# Patient Record
Sex: Female | Born: 1995
Health system: Southern US, Academic
[De-identification: ages and names within clinical notes are randomized; demographics above are authoritative.]

## PROBLEM LIST (undated history)

## (undated) ENCOUNTER — Ambulatory Visit: Attending: Advanced Practice Midwife | Primary: Advanced Practice Midwife

## (undated) ENCOUNTER — Encounter

## (undated) ENCOUNTER — Telehealth

## (undated) ENCOUNTER — Encounter: Attending: Clinical | Primary: Clinical

## (undated) ENCOUNTER — Ambulatory Visit

## (undated) ENCOUNTER — Ambulatory Visit: Payer: MEDICAID

## (undated) ENCOUNTER — Encounter: Attending: Advanced Practice Midwife | Primary: Advanced Practice Midwife

## (undated) ENCOUNTER — Telehealth: Attending: Advanced Practice Midwife | Primary: Advanced Practice Midwife

## (undated) DIAGNOSIS — J45909 Unspecified asthma, uncomplicated: Secondary | ICD-10-CM

---
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
## 2016-02-28 ENCOUNTER — Ambulatory Visit (HOSPITAL_COMMUNITY)
Admission: EM | Admit: 2016-02-28 | Discharge: 2016-02-28 | Disposition: A | Discharge status: Home / Self Care | Payer: Medicaid Other | Attending: Family Medicine | Admitting: Family Medicine

## 2016-02-28 ENCOUNTER — Encounter (HOSPITAL_COMMUNITY): Payer: Self-pay | Admitting: Emergency Medicine

## 2016-02-28 DIAGNOSIS — J029 Acute pharyngitis, unspecified: Secondary | ICD-10-CM | POA: Diagnosis not present

## 2016-02-28 HISTORY — DX: Unspecified asthma, uncomplicated: J45.909

## 2016-02-28 LAB — POCT RAPID STREP A: Streptococcus, Group A Screen (Direct): NEGATIVE

## 2016-02-28 NOTE — ED Triage Notes (Signed)
Pt has been suffering from a sore throat since yesterday.  Pt denies any fever.

## 2016-02-28 NOTE — ED Provider Notes (Signed)
CSN: [id number]     Arrival date & time 02/28/16  1427 History   First MD Initiated Contact with Patient 02/28/16 1530     Chief Complaint  Patient presents with  . Sore Throat   (Consider location/radiation/quality/duration/timing/severity/associated sxs/prior Treatment) The history is provided by the patient.  Sore Throat  This is a new problem. The current episode started yesterday. The problem occurs constantly. The problem has been gradually worsening. Pertinent negatives include no chest pain, no abdominal pain, no headaches and no shortness of breath. The symptoms are aggravated by swallowing. Nothing relieves the symptoms. She has tried nothing for the symptoms.    Past Medical History:  Diagnosis Date  . Asthma    History reviewed. No pertinent surgical history. History reviewed. No pertinent family history. Social History  Substance Use Topics  . Smoking status: Never Smoker  . Smokeless tobacco: Never Used  . Alcohol use No   OB History    No data available     Review of Systems  Constitutional: Negative for fever.  HENT: Positive for ear pain and sore throat. Negative for congestion, rhinorrhea, sinus pain, sinus pressure and sneezing.   Respiratory: Negative for cough and shortness of breath.   Cardiovascular: Negative for chest pain.  Gastrointestinal: Negative for abdominal pain, nausea and vomiting.  Neurological: Negative for dizziness and headaches.    Allergies  Patient has no known allergies.  Home Medications   Prior to Admission medications   Not on File   Meds Ordered and Administered this Visit  Medications - No data to display  BP 125/81 (BP Location: Left Arm)   Pulse 88   Temp 98.7 F (37.1 C) (Oral)   SpO2 100%  No data found.   Physical Exam  Constitutional: She is oriented to person, place, and time. She appears well-developed and well-nourished.  HENT:  Head: Normocephalic and atraumatic.  Right Ear: External ear normal.   Left Ear: External ear normal.  Nose: Nose normal.  Mouth/Throat: Oropharynx is clear and moist. No oropharyngeal exudate.  TM normal bilaterally  Eyes: EOM are normal. Pupils are equal, round, and reactive to light.  Neck: Normal range of motion. Neck supple.  Cardiovascular: Normal rate, regular rhythm and normal heart sounds.   Pulmonary/Chest: Effort normal and breath sounds normal.  Abdominal: Soft. Bowel sounds are normal. She exhibits no distension. There is no tenderness.  Lymphadenopathy:    She has no cervical adenopathy.  Neurological: She is alert and oriented to person, place, and time.  Skin: Skin is warm and dry.  Psychiatric: She has a normal mood and affect.  Nursing note and vitals reviewed.   Urgent Care Course   Clinical Course     Procedures (including critical care time)  Labs Review Labs Reviewed  POCT RAPID STREP A    Imaging Review No results found.  MDM   1. Viral pharyngitis    Rapid strep negative. Culture pending. Informed to treat symptomatically with salt water gargle, honey, throat lozenges. May take ibuprofen for pain relief as well.  Informed that this is self-limited and will improve. Informed to return or f/u with PCP if sore persist in 1-2 weeks.    Lucia EstelleFeng Truth Wolaver, NP 02/28/16 1610

## 2016-02-29 LAB — CULTURE, GROUP A STREP (THRC)

## 2016-04-17 ENCOUNTER — Encounter (HOSPITAL_COMMUNITY): Payer: Self-pay | Admitting: Emergency Medicine

## 2016-04-17 ENCOUNTER — Emergency Department (HOSPITAL_COMMUNITY)
Admission: EM | Admit: 2016-04-17 | Discharge: 2016-04-17 | Disposition: A | Discharge status: Home / Self Care | Payer: Medicaid Other | Attending: Emergency Medicine | Admitting: Emergency Medicine

## 2016-04-17 DIAGNOSIS — J029 Acute pharyngitis, unspecified: Secondary | ICD-10-CM | POA: Insufficient documentation

## 2016-04-17 DIAGNOSIS — J45909 Unspecified asthma, uncomplicated: Secondary | ICD-10-CM | POA: Insufficient documentation

## 2016-04-17 LAB — RAPID STREP SCREEN (MED CTR MEBANE ONLY): Streptococcus, Group A Screen (Direct): NEGATIVE

## 2016-04-17 MED ORDER — DEXAMETHASONE 10 MG/ML FOR PEDIATRIC ORAL USE
10.0000 mg | Freq: Once | INTRAMUSCULAR | Status: AC
  Administered 2016-04-17: 10 mg via ORAL
  Filled 2016-04-17: qty 1

## 2016-04-17 NOTE — ED Notes (Signed)
Pt reports a sore throat and inflammation for the past 3 days.

## 2016-04-17 NOTE — ED Provider Notes (Signed)
Emergency Department Provider Note   I have reviewed the triage vital signs and the nursing notes.   HISTORY  Chief Complaint Sore Throat   HPI Brenda Gilmore is a 21 y.o. female with PMH of asthma presents to the emergency department for evaluation of sore throat for the past 3 days. She notes posterior pharyngeal swelling and pain with swallowing. She denies fever, cough, shaking chills. No sick contacts with similar symptoms. She denies any body aches, chest pain, vomiting or diarrhea. She is tried over-the-counter medications with little relief in symptoms. Pain is made worse with swallowing. No alleviating factors. Pain is sharp in quality and severe. Patient describes as constant pain.   Past Medical History:  Diagnosis Date  . Asthma     There are no active problems to display for this patient.   History reviewed. No pertinent surgical history.    Allergies Patient has no known allergies.  No family history on file.  Social History Social History  Substance Use Topics  . Smoking status: Never Smoker  . Smokeless tobacco: Never Used  . Alcohol use No    Review of Systems  Constitutional: No fever/chills Eyes: No visual changes. ENT: Positive sore throat. Cardiovascular: Denies chest pain. Respiratory: Denies shortness of breath. Gastrointestinal: No abdominal pain.  No nausea, no vomiting.  No diarrhea.  No constipation. Genitourinary: Negative for dysuria. Musculoskeletal: Negative for back pain. Skin: Negative for rash. Neurological: Negative for headaches, focal weakness or numbness.  10-point ROS otherwise negative.  ____________________________________________   PHYSICAL EXAM:  VITAL SIGNS: ED Triage Vitals  Enc Vitals Group     BP 04/17/16 0641 138/71     Pulse Rate 04/17/16 0641 88     Resp 04/17/16 0641 16     Temp 04/17/16 0641 98.6 F (37 C)     Temp Source 04/17/16 0641 Oral     SpO2 04/17/16 0641 100 %     Weight 04/17/16  0649 198 lb (89.8 kg)     Height 04/17/16 0649 5\' 2"  (1.575 m)     Pain Score 04/17/16 0649 9   Constitutional: Alert and oriented. Well appearing and in no acute distress. Eyes: Conjunctivae are normal.  Head: Atraumatic. Nose: No congestion/rhinnorhea. Mouth/Throat: Mucous membranes are moist.  Oropharynx with enlarged tonsils bilaterally and positive exudate. Managing oral secretions.  Neck: No stridor.  Cardiovascular: Normal rate, regular rhythm. Good peripheral circulation. Grossly normal heart sounds.   Respiratory: Normal respiratory effort.  No retractions. Lungs CTAB. Gastrointestinal: Soft and nontender. No distention.  Musculoskeletal: No lower extremity tenderness nor edema. No gross deformities of extremities. Neurologic:  Normal speech and language. No gross focal neurologic deficits are appreciated.  Skin:  Skin is warm, dry and intact. No rash noted.  ____________________________________________   LABS (all labs ordered are listed, but only abnormal results are displayed)  Labs Reviewed  RAPID STREP SCREEN (NOT AT [facility])  CULTURE, GROUP A STREP [facility])   ____________________________________________   PROCEDURES  Procedure(s) performed:   Procedures  None ____________________________________________   INITIAL IMPRESSION / ASSESSMENT AND PLAN / ED COURSE  Pertinent labs & imaging results that were available during my care of the patient were reviewed by me and considered in my medical decision making (see chart for details).  Patient resents to the emergency department for evaluation of sore throat for the past 3 days. On exam the patient has bilateral tonsillar hypertrophy with scant exudate. Plan for rapid strep test and Decadron. No indication for advanced  imaging of the neck. Patient's speaking in normal tone of voice and managing oral secretions. No respiratory distress.  Strep negative. Gave decadron and will discharge with plan for supportive care at  home and return precautions.  ____________________________________________  FINAL CLINICAL IMPRESSION(S) / ED DIAGNOSES  Final diagnoses:  Pharyngitis, unspecified etiology     MEDICATIONS GIVEN DURING THIS VISIT:  Medications  dexamethasone (DECADRON) 10 MG/ML injection for Pediatric ORAL use 10 mg (10 mg Oral Given 04/17/16 0808)     NEW OUTPATIENT MEDICATIONS STARTED DURING THIS VISIT:  There are no discharge medications for this patient.    Note:  This document was prepared using Dragon voice recognition software and may include unintentional dictation errors.  Alona Bene, MD Emergency Medicine   Maia Plan, MD 04/17/16 [phone]

## 2016-04-17 NOTE — ED Triage Notes (Signed)
Pt states she has been having a sore throat and throat inflammation. Pt states this has been going on now for the past 3 days. Pt reports no PCP locally.

## 2016-04-17 NOTE — Discharge Instructions (Signed)

## 2016-04-18 LAB — CULTURE, GROUP A STREP (THRC)

## 2016-08-10 ENCOUNTER — Encounter (HOSPITAL_COMMUNITY): Payer: Self-pay | Admitting: *Deleted

## 2016-08-10 ENCOUNTER — Emergency Department (HOSPITAL_COMMUNITY)
Admission: EM | Admit: 2016-08-10 | Discharge: 2016-08-10 | Disposition: A | Discharge status: Home / Self Care | Payer: Medicaid Other | Attending: Emergency Medicine | Admitting: Emergency Medicine

## 2016-08-10 DIAGNOSIS — M545 Low back pain: Secondary | ICD-10-CM | POA: Diagnosis not present

## 2016-08-10 DIAGNOSIS — R509 Fever, unspecified: Secondary | ICD-10-CM | POA: Insufficient documentation

## 2016-08-10 DIAGNOSIS — J45909 Unspecified asthma, uncomplicated: Secondary | ICD-10-CM | POA: Diagnosis not present

## 2016-08-10 DIAGNOSIS — J029 Acute pharyngitis, unspecified: Secondary | ICD-10-CM | POA: Diagnosis not present

## 2016-08-10 LAB — URINALYSIS, ROUTINE W REFLEX MICROSCOPIC
BILIRUBIN URINE: NEGATIVE
GLUCOSE, UA: NEGATIVE mg/dL
Hgb urine dipstick: NEGATIVE
KETONES UR: NEGATIVE mg/dL
Leukocytes, UA: NEGATIVE
NITRITE: NEGATIVE
PH: 6 (ref 5.0–8.0)
PROTEIN: NEGATIVE mg/dL
Specific Gravity, Urine: 1.021 (ref 1.005–1.030)

## 2016-08-10 LAB — RAPID STREP SCREEN (MED CTR MEBANE ONLY): Streptococcus, Group A Screen (Direct): NEGATIVE

## 2016-08-10 MED ORDER — IBUPROFEN 100 MG/5ML PO SUSP
800.0000 mg | Freq: Once | ORAL | Status: AC
  Administered 2016-08-10: 800 mg via ORAL
  Filled 2016-08-10: qty 40

## 2016-08-10 MED ORDER — SODIUM CHLORIDE 0.9 % IV BOLUS (SEPSIS)
1000.0000 mL | Freq: Once | INTRAVENOUS | Status: AC
  Administered 2016-08-10: 1000 mL via INTRAVENOUS

## 2016-08-10 MED ORDER — AMOXICILLIN 250 MG/5ML PO SUSR: 500.0000 mg | Freq: Two times a day (BID) | ORAL | 0 refills | Status: DC

## 2016-08-10 MED ORDER — ACETAMINOPHEN 325 MG PO TABS
ORAL_TABLET | ORAL | Status: AC
  Filled 2016-08-10: qty 2

## 2016-08-10 MED ORDER — ACETAMINOPHEN 325 MG PO TABS
650.0000 mg | ORAL_TABLET | Freq: Once | ORAL | Status: AC | PRN
  Administered 2016-08-10: 650 mg via ORAL

## 2016-08-10 MED ORDER — DEXAMETHASONE SODIUM PHOSPHATE 10 MG/ML IJ SOLN
10.0000 mg | Freq: Once | INTRAMUSCULAR | Status: AC
  Administered 2016-08-10: 10 mg via INTRAVENOUS
  Filled 2016-08-10: qty 1

## 2016-08-10 MED ORDER — AMOXICILLIN 250 MG/5ML PO SUSR
500.0000 mg | Freq: Once | ORAL | Status: AC
  Administered 2016-08-10: 500 mg via ORAL
  Filled 2016-08-10: qty 10

## 2016-08-10 NOTE — Discharge Instructions (Signed)
It was my pleasure taking care of you today!  Please take all of your antibiotics until finished!  Alternate between Tylenol and ibuprofen for fevers and pain.  Please follow-up with your primary care provider in regards to today's visit.  Return to the emergency department for inability to keep down fluids, new or worsening symptoms, any additional concerns.

## 2016-08-10 NOTE — ED Provider Notes (Signed)
[facility]-EMERGENCY DEPT Provider Note   CSN: [id number] Arrival date & time: 08/10/16  0805     History   Chief Complaint Chief Complaint  Patient presents with  . Fever  . Back Pain  . Sore Throat    HPI Brenda Gilmore is a 21 y.o. female.  The history is provided by the patient and medical records. No language interpreter was used.  Fever   Associated symptoms include sore throat. Pertinent negatives include no congestion.  Back Pain   Associated symptoms include a fever.  Sore Throat    Brenda Gilmore is a 21 y.o. female  with a PMH of asthma who presents to the Emergency Department complaining of sore throat and low back pain which began yesterday morning when she awoke. She felt warm, but did not check temperature. Temp of 100.7 in triage. States that she has had strep 3-4 times in the last year and this feels similar. Last time she had a strep test here, she states it was negative and she never received a phone call from culture, however the next week, she went to her PCP at home where the strep test was positive. No sick contacts. No cough, congestion, shortness of breath, dysuria, vaginal discharge, abdominal pain. No medications taken prior to arrival for symptoms. Able to tolerate fluids fine.    Past Medical History:  Diagnosis Date  . Asthma     There are no active problems to display for this patient.   History reviewed. No pertinent surgical history.  OB History    No data available       Home Medications    Prior to Admission medications   Medication Sig Start Date End Date Taking? Authorizing Provider  Adalimumab 40 MG/0.8ML PNKT Inject 40 mg into the skin every Thursday. 01/04/16  Yes [provider]  albuterol (PROAIR HFA) 108 (90 Base) MCG/ACT inhaler Inhale 2 puffs into the lungs every 4 (four) hours as needed for wheezing or shortness of breath.  11/17/14  Yes [provider]  medroxyPROGESTERone (DEPO-PROVERA) 150 MG/ML injection  Inject 150 mg into the muscle every 3 (three) months.   Yes [provider]  montelukast (SINGULAIR) 10 MG tablet Take 10 mg by mouth daily as needed (for allergies).  11/17/14  Yes [provider]  amoxicillin (AMOXIL) 250 MG/5ML suspension Take 10 mLs (500 mg total) by mouth 2 (two) times daily. 08/10/16   Miracle Criado, Chase PicketJaime Pilcher, PA-C    Family History History reviewed. No pertinent family history.  Social History Social History  Substance Use Topics  . Smoking status: Never Smoker  . Smokeless tobacco: Never Used  . Alcohol use No     Allergies   Patient has no known allergies.   Review of Systems Review of Systems  Constitutional: Positive for fever.  HENT: Positive for sore throat. Negative for congestion, ear pain, sinus pressure and voice change.   Musculoskeletal: Positive for back pain.     Physical Exam Updated Vital Signs BP 111/69   Pulse 90   Temp 99.7 F (37.6 C) (Oral)   Resp 18   Ht 5\' 2"  (1.575 m)   Wt 88.5 kg (195 lb)   SpO2 100%   BMI 35.67 kg/m   Physical Exam  Constitutional: She is oriented to person, place, and time. She appears well-developed and well-nourished. No distress.  HENT:  Head: Normocephalic and atraumatic.  OP with erythema and tonsillar hypertrophy. Small amount of exudates. No PTA. No neck tenderness. No  hoarseness.  Neck: Normal range of motion. Neck supple.  No meningeal signs.   Cardiovascular: Normal heart sounds.   Tachycardic but regular.  Pulmonary/Chest: Effort normal.  Lungs are clear to auscultation bilaterally - no w/r/r  Abdominal: Soft. She exhibits no distension. There is no tenderness.  Musculoskeletal: Normal range of motion.  Neurological: She is alert and oriented to person, place, and time.  Skin: Skin is warm and dry. She is not diaphoretic.  Nursing note and vitals reviewed.    ED Treatments / Results  Labs (all labs ordered are listed, but only abnormal results are displayed) Labs  Reviewed  URINALYSIS, ROUTINE W REFLEX MICROSCOPIC - Abnormal; Notable for the following:       Result Value   APPearance HAZY (*)    All other components within normal limits  RAPID STREP SCREEN (NOT AT [facility])  CULTURE, GROUP A STREP [facility])    EKG  EKG Interpretation None       Radiology No results found.  Procedures Procedures (including critical care time)  Medications Ordered in ED Medications  acetaminophen (TYLENOL) 325 MG tablet (not administered)  acetaminophen (TYLENOL) tablet 650 mg (650 mg Oral Given 08/10/16 0819)  dexamethasone (DECADRON) injection 10 mg (10 mg Intravenous Given 08/10/16 0913)  ibuprofen (ADVIL,MOTRIN) 100 MG/5ML suspension 800 mg (800 mg Oral Given 08/10/16 1008)  sodium chloride 0.9 % bolus 1,000 mL (1,000 mLs Intravenous New Bag/Given 08/10/16 1123)  amoxicillin (AMOXIL) 250 MG/5ML suspension 500 mg (500 mg Oral Given 08/10/16 1123)     Initial Impression / Assessment and Plan / ED Course  I have reviewed the triage vital signs and the nursing notes.  Pertinent labs & imaging results that were available during my care of the patient were reviewed by me and considered in my medical decision making (see chart for details).    Brenda Gilmore is a 21 y.o. female who presents to ED for sore throat and low back pain which began yesterday morning. History of strep in the past and this feels similar. Strep test in December 2017 and January 2018 negative, but culture was positive. On exam, patient is febrile (100.7), tachycardic with erythematous, hypertrophied tonsils. Given decadron in ED. Anti-pyretics also given. Since she has had two negative tests with positive cultures along with exam concerning for strep, will treat with amoxil.   Patient still mildly tachycardic. Fever improving. 1L IV fluids given.   On reevaluation, the patient feels improved. Heart rate normalized after fluids. Temp down to 99.7. Home care instructions discussed with patient. Rx  for Amoxil given. PCP follow-up encouraged. All questions answered.   Final Clinical Impressions(s) / ED Diagnoses   Final diagnoses:  Pharyngitis, unspecified etiology    New Prescriptions New Prescriptions   AMOXICILLIN (AMOXIL) 250 MG/5ML SUSPENSION    Take 10 mLs (500 mg total) by mouth 2 (two) times daily.     Judeth Gilles, Chase Picket, PA-C 08/10/16 1220    Raeford Razor, MD 08/11/16 [phone]

## 2016-08-10 NOTE — ED Triage Notes (Signed)
PT reports she has had  Strep . Throat 4 times this year.  Pt reports she has not seen an ENT doctor.

## 2016-08-10 NOTE — ED Triage Notes (Signed)
Pt reports having fever, bodyaches, chills, headache, sore throat and lower back pain since yesterday morning. Denies urinary symptoms. Mask on pt at triage.

## 2016-08-12 LAB — CULTURE, GROUP A STREP (THRC)

## 2016-09-25 MED FILL — HUMIRA PF PEN (BOX)/40MG/0.8mL/PEN: HUMIRA PF PEN (BOX)/40MG/0.8mL/PEN | 28 days supply | Qty: 2 | Fill #4

## 2016-10-16 NOTE — Unmapped (Signed)
Specialty Pharmacy Refill Coordination Note     Meredith Turner is a 21 y.o. female contacted today regarding refills of her specialty medication(s).    Reviewed and verified with patient:     [address] [place] [address]  Specialty medication(s) and dose(s) confirmed: yes  Changes to medications: no  Changes to insurance: no    Medication Adherence    Patient reported X missed doses in the last month:  0  Specialty Medication:  Humira  Medication Assistance Program  Refill Coordination  Has the Patient's Contact Information Changed:  No  Is the Shipping Address Different:  No  Shipping Information  Delivery Scheduled:  Yes  Delivery Date:  10/24/16  Medications to be Shipped:  Humira             Marzetta Board  Specialty Pharmacy Technician

## 2016-10-23 MED FILL — HUMIRA PF PEN (BOX)/40MG/0.8mL/PEN: HUMIRA PF PEN (BOX)/40MG/0.8mL/PEN | 28 days supply | Qty: 2 | Fill #5

## 2016-11-11 NOTE — Unmapped (Signed)
Specialty Pharmacy Refill Coordination Note     Meredith Turner is a 21 y.o. female contacted today regarding refills of her specialty medication(s).    Reviewed and verified with patient:     [address]  [place] [address]    Specialty medication(s) and dose(s) confirmed: yes  Changes to medications: no  Changes to insurance: no    Medication Adherence    Patient reported X missed doses in the last month:  0  Specialty Medication:  HUMIRA  Medication Assistance Program  Refill Coordination  Has the Patient's Contact Information Changed:  No  Is the Shipping Address Different:  No  Shipping Information  Delivery Scheduled:  Yes  Delivery Date:  11/26/16  Medications to be Shipped:  HUMIRA       Marzetta Board  Specialty Pharmacy Technician

## 2016-11-25 ENCOUNTER — Ambulatory Visit: Admission: RE | Admit: 2016-11-25 | Discharge: 2016-11-25

## 2016-11-25 DIAGNOSIS — L910 Hypertrophic scar: Secondary | ICD-10-CM

## 2016-11-25 DIAGNOSIS — L732 Hidradenitis suppurativa: Principal | ICD-10-CM

## 2016-11-25 NOTE — Unmapped (Signed)
Had to leave a voicemail for pt, when I tried to bill her Humira, she only has family planning Medicaid. We will need her to call them to get that straighten out or an updated insurance-ja

## 2016-11-25 NOTE — Unmapped (Signed)
ASSESSMENT AND PLAN:    1. Hidradenitis suppurativa: better currently   - stop adalimumab.  She would like to try being off for a while. She knows to restart with flaring and discussed re-loading protocol.  May also consider punch unroofings if recurrence is limited to the two small nodules on the chest.   -continue clindamycin wipes bid  -last quant gold 12/2015    rtc 3-6 months.     CHIEF COMPLAINT:  Hidradenitis suppurativa    HPI:   This is a pleasant 21 y.o.-year-old who last saw Dr. Janyth Turner in 08/2016. She presents today for follow up of hidradenitis.   It is located underneath both breasts and is much better following unroofings in 04/2015.  She has had 2 small areas that have occasionally flared within and adjacent to the scar and restarted on Humira 12/2015.   No side effects noted from the the Humira. She would like to try stopping the Humira today.    DLQI 2, VAS Pain 0, Self reported severity 1    Disease Course  Previous Treatments: Doxycycline, ILK, Hibiclens, Topical Clindamycin  Symptoms since age 69  Family History: none  Relationship to menses: none  Current form of contraception: none  Nonsmoker  PAST MEDICAL HISTORY:  Hidradenitis  Acne    MEDICATIONS:      Current Outpatient Prescriptions on File Prior to Visit   Medication Sig Dispense Refill   ??? adalimumab (HUMIRA) 40 mg/0.8 mL subcutaneous pen kit Inject one pen subcutaneously every 7 days 6 each 8   ??? amoxicillin (AMOXIL) 250 mg/5 mL suspension Take 500 mg by mouth.     ??? fluocinonide (LIDEX) 0.05 % ointment Apply twice a day to flared spots on chest as needed 60 g 5   ??? fluticasone (FLONASE) 50 mcg/actuation nasal spray instill 1 spray into each nostril once daily instill 2 sprays into each nostril once daily prn  1   ??? medroxyPROGESTERone (DEPO-PROVERA) 150 mg/mL injection INJECT I.M. EVERY 3 MONTHS  0   ??? montelukast (SINGULAIR) 10 mg tablet Take 10 mg by mouth daily.  1   ??? PROAIR HFA 90 mcg/actuation inhaler INHALE 2 PUFFS EVERY 4 TO 6 HOURS AS NEEDED FOR WHEEZING  1   ??? triamcinolone (KENALOG) 0.1 % ointment Apply topically Two (2) times a day. Twice daily as needed for 2 weeks as needed 80 g 1     Current Facility-Administered Medications on File Prior to Visit   Medication Dose Route Frequency Provider Last Rate Last Dose   ??? triamcinolone acetonide (KENALOG-40) injection 20 mg  20 mg Other Once Meredith Haines, MD           ALLERGIES:   Patient has no known allergies.      REVIEW OF SYSTEMS:  Baseline state of health. No recent illnesses. No other skin complaints. The balance of 10 systems negative.    PHYSICAL EXAMINATION:  General: Well-developed, well-nourished. No acute distress.   Neuro: Alert and oriented, answers questions appropriately.  Skin: examination of the scalp, face, neck, chest, abdomen,and bilateral upper extremities was performed and notable for the following:  --In the inframammary region: well-healed scars on bilateral inframammary areas with two small non-inflammatory nodules ~3-36mm in size within/adjacent to unroofing sites.

## 2016-11-25 NOTE — Unmapped (Addendum)

## 2016-12-17 NOTE — Unmapped (Signed)
Spoke with patient, and she is still off of Humira therapy. Removing from [facility] call list for now, but patient has my contact info in case she decides to resume therapy.    Keyunna Coco A. Katrinka Blazing, PharmD - Pharmacist   [facility]   [address] [place], [place] [address]   t [phone] - f [phone]

## 2017-03-23 ENCOUNTER — Emergency Department (HOSPITAL_COMMUNITY)
Admission: EM | Admit: 2017-03-23 | Discharge: 2017-03-23 | Disposition: A | Discharge status: Home / Self Care | Payer: Self-pay | Attending: Emergency Medicine | Admitting: Emergency Medicine

## 2017-03-23 ENCOUNTER — Encounter (HOSPITAL_COMMUNITY): Payer: Self-pay | Admitting: Emergency Medicine

## 2017-03-23 ENCOUNTER — Other Ambulatory Visit: Payer: Self-pay

## 2017-03-23 DIAGNOSIS — J45909 Unspecified asthma, uncomplicated: Secondary | ICD-10-CM | POA: Insufficient documentation

## 2017-03-23 DIAGNOSIS — B9789 Other viral agents as the cause of diseases classified elsewhere: Secondary | ICD-10-CM

## 2017-03-23 DIAGNOSIS — Z79899 Other long term (current) drug therapy: Secondary | ICD-10-CM | POA: Insufficient documentation

## 2017-03-23 DIAGNOSIS — J069 Acute upper respiratory infection, unspecified: Secondary | ICD-10-CM | POA: Insufficient documentation

## 2017-03-23 LAB — RAPID STREP SCREEN (MED CTR MEBANE ONLY): Streptococcus, Group A Screen (Direct): NEGATIVE

## 2017-03-23 MED ORDER — BENZONATATE 100 MG PO CAPS: 100.0000 mg | ORAL_CAPSULE | Freq: Three times a day (TID) | ORAL | 0 refills | Status: DC

## 2017-03-23 MED ORDER — GUAIFENESIN 100 MG/5ML PO LIQD: 100.0000 mg | ORAL | 0 refills | Status: DC | PRN

## 2017-03-23 NOTE — ED Provider Notes (Signed)
[facility]-EMERGENCY DEPT Provider Note   CSN: [id number] Arrival date & time: 03/23/17  1333     History   Chief Complaint Chief Complaint  Patient presents with  . Cough  . Otalgia    HPI Brenda Gilmore is a 22 y.o. female.  HPI   22 year old female here with cough and ear pain. Started 4 days ago, productive with yellow mucous, worse at night time.  Tries OTC meds without relief.  Hx of asthma, non smoker.  Has headache, fever, chills without myalgias.  Report pressure in her head, watery eyes but non itching.  Has ear pain and pain with swallowing, runny nose.  Report wheeze with cough and sob.  No n/v/d.  Symptoms that bothers her the most is sore throat. States she has history of strep throat in the past. Also reports she has history of cheese and her allergy medication isn't helping.  Past Medical History:  Diagnosis Date  . Asthma     There are no active problems to display for this patient.   History reviewed. No pertinent surgical history.  OB History    No data available       Home Medications    Prior to Admission medications   Medication Sig Start Date End Date Taking? Authorizing Provider  albuterol (PROAIR HFA) 108 (90 Base) MCG/ACT inhaler Inhale 2 puffs into the lungs every 4 (four) hours as needed for wheezing or shortness of breath.  11/17/14  Yes [provider]  fluticasone (FLONASE) 50 MCG/ACT nasal spray Place 2 sprays into both nostrils daily as needed for allergies or rhinitis.   Yes [provider]  montelukast (SINGULAIR) 10 MG tablet Take 10 mg by mouth daily as needed (for allergies).  11/17/14  Yes [provider]  phenol (EQ SORE THROAT SPRAY) 1.4 % LIQD Use as directed 1 spray in the mouth or throat as needed for throat irritation / pain.   Yes [provider]  Adalimumab 40 MG/0.8ML PNKT Inject 40 mg into the skin every Thursday. 01/04/16   [provider]  amoxicillin  (AMOXIL) 250 MG/5ML suspension Take 10 mLs (500 mg total) by mouth 2 (two) times daily. Patient not taking: Reported on 03/23/2017 08/10/16   Ward, Chase PicketJaime Pilcher, PA-C    Family History History reviewed. No pertinent family history.  Social History Social History   Tobacco Use  . Smoking status: Never Smoker  . Smokeless tobacco: Never Used  Substance Use Topics  . Alcohol use: No  . Drug use: No     Allergies   Patient has no known allergies.   Review of Systems Review of Systems  All other systems reviewed and are negative.    Physical Exam Updated Vital Signs BP (!) 143/83 (BP Location: Right Arm)   Pulse 85   Temp 98.1 F (36.7 C) (Oral)   Resp 18   LMP 03/11/2017 (Approximate)   SpO2 100%   Physical Exam  Constitutional: She appears well-developed and well-nourished. No distress.  Obese female nontoxic in appearance  HENT:  Head: Atraumatic.  Ears: Normal TMs bilaterally Nose: Mildly boggy turbinate Throat: Uvula is midline, bilateral tonsillar enlargement without exudate, no trismus   Eyes: Conjunctivae are normal.  Neck: Neck supple.  Cardiovascular: Normal rate and regular rhythm.  Pulmonary/Chest: Effort normal and breath sounds normal. No stridor. No respiratory distress. She has no wheezes. She has no rales.  Abdominal: Soft. There is no tenderness.  Lymphadenopathy:  She has no cervical adenopathy.  Neurological: She is alert.  Skin: No rash noted.  Psychiatric: She has a normal mood and affect.  Nursing note and vitals reviewed.    ED Treatments / Results  Labs (all labs ordered are listed, but only abnormal results are displayed) Labs Reviewed  RAPID STREP SCREEN (NOT AT [facility])  CULTURE, GROUP A STREP [facility])    EKG  EKG Interpretation None       Radiology No results found.  Procedures Procedures (including critical care time)  Medications Ordered in ED Medications - No data to display   Initial Impression / Assessment  and Plan / ED Course  I have reviewed the triage vital signs and the nursing notes.  Pertinent labs & imaging results that were available during my care of the patient were reviewed by me and considered in my medical decision making (see chart for details).     BP (!) 143/83 (BP Location: Right Arm)   Pulse 85   Temp 98.1 F (36.7 C) (Oral)   Resp 18   LMP 03/11/2017 (Approximate)   SpO2 100%    Final Clinical Impressions(s) / ED Diagnoses   Final diagnoses:  Viral URI with cough    ED Discharge Orders        Ordered    benzonatate (TESSALON) 100 MG capsule  Every 8 hours     03/23/17 1804    guaiFENesin (ROBITUSSIN) 100 MG/5ML liquid  Every 4 hours PRN     03/23/17 1804     Pt symptoms consistent with URI.  Pt will be discharged with symptomatic treatment.  Discussed return precautions.  Pt is hemodynamically stable & in NAD prior to discharge.     Fayrene Helper, PA-C 03/23/17 1805    Arby Barrette, MD 03/31/17 [phone]

## 2017-03-23 NOTE — ED Triage Notes (Signed)
Pt c/o cough and bilateral ear pain x several days. Pt has only taken Singulair.

## 2017-03-26 LAB — CULTURE, GROUP A STREP (THRC)

## 2017-09-03 ENCOUNTER — Encounter

## 2018-12-28 ENCOUNTER — Ambulatory Visit: Admit: 2018-12-28 | Discharge: 2018-12-29

## 2018-12-28 DIAGNOSIS — L910 Hypertrophic scar: Principal | ICD-10-CM

## 2018-12-28 DIAGNOSIS — L732 Hidradenitis suppurativa: Principal | ICD-10-CM

## 2018-12-28 DIAGNOSIS — Z79899 Other long term (current) drug therapy: Principal | ICD-10-CM

## 2018-12-28 MED ORDER — ADALIMUMAB 80 MG/0.8 ML SUBCUTANEOUS PEN KIT: kit | 0 refills | 0 days | Status: AC

## 2018-12-28 MED ORDER — ADALIMUMAB PEN CITRATE FREE 40 MG/0.4 ML: each | 11 refills | 0 days | Status: AC

## 2018-12-29 DIAGNOSIS — Z79899 Other long term (current) drug therapy: Principal | ICD-10-CM

## 2019-06-15 ENCOUNTER — Encounter: Admit: 2019-06-15 | Discharge: 2019-06-16 | Payer: PRIVATE HEALTH INSURANCE

## 2019-07-05 ENCOUNTER — Encounter: Admit: 2019-07-05 | Discharge: 2019-07-06 | Payer: PRIVATE HEALTH INSURANCE

## 2019-07-06 ENCOUNTER — Ambulatory Visit: Admit: 2019-07-06 | Discharge: 2019-07-07 | Payer: PRIVATE HEALTH INSURANCE

## 2019-07-19 ENCOUNTER — Ambulatory Visit (INDEPENDENT_AMBULATORY_CARE_PROVIDER_SITE_OTHER): Payer: Self-pay

## 2019-07-19 ENCOUNTER — Ambulatory Visit (HOSPITAL_COMMUNITY)
Admission: EM | Admit: 2019-07-19 | Discharge: 2019-07-19 | Disposition: A | Discharge status: Home / Self Care | Payer: Self-pay | Attending: Internal Medicine | Admitting: Internal Medicine

## 2019-07-19 ENCOUNTER — Encounter (HOSPITAL_COMMUNITY): Payer: Self-pay

## 2019-07-19 ENCOUNTER — Other Ambulatory Visit: Payer: Self-pay

## 2019-07-19 DIAGNOSIS — S139XXA Sprain of joints and ligaments of unspecified parts of neck, initial encounter: Secondary | ICD-10-CM

## 2019-07-19 MED ORDER — IBUPROFEN 600 MG PO TABS: 600.0000 mg | ORAL_TABLET | Freq: Four times a day (QID) | ORAL | 0 refills | Status: AC | PRN

## 2019-07-19 MED ORDER — CYCLOBENZAPRINE HCL 10 MG PO TABS: 10.0000 mg | ORAL_TABLET | Freq: Two times a day (BID) | ORAL | 0 refills | Status: AC | PRN
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
## 2019-08-31 ENCOUNTER — Ambulatory Visit: Admit: 2019-08-31 | Discharge: 2019-09-01

## 2019-08-31 MED ORDER — HYDROCODONE 5 MG-ACETAMINOPHEN 325 MG TABLET
ORAL_TABLET | ORAL | 0 refills | 0.00000 days | Status: CP
Start: 2019-08-31 — End: ?

## 2020-03-27 ENCOUNTER — Encounter: Admit: 2020-03-27 | Discharge: 2020-03-28 | Payer: MEDICAID

## 2020-03-27 DIAGNOSIS — L732 Hidradenitis suppurativa: Principal | ICD-10-CM

## 2020-03-27 DIAGNOSIS — L723 Sebaceous cyst: Principal | ICD-10-CM

## 2020-03-27 MED ORDER — DOXYCYCLINE HYCLATE 100 MG TABLET
ORAL_TABLET | Freq: Two times a day (BID) | ORAL | 2 refills | 30.00000 days | Status: CP
Start: 2020-03-27 — End: ?

## 2020-03-28 ENCOUNTER — Emergency Department (HOSPITAL_COMMUNITY)
Admission: EM | Admit: 2020-03-28 | Discharge: 2020-03-28 | Disposition: A | Discharge status: Home / Self Care | Payer: Medicaid Other | Attending: Emergency Medicine | Admitting: Emergency Medicine

## 2020-03-28 ENCOUNTER — Other Ambulatory Visit: Payer: Self-pay

## 2020-03-28 ENCOUNTER — Emergency Department (HOSPITAL_COMMUNITY): Payer: Medicaid Other

## 2020-03-28 DIAGNOSIS — R509 Fever, unspecified: Secondary | ICD-10-CM | POA: Insufficient documentation

## 2020-03-28 DIAGNOSIS — R059 Cough, unspecified: Secondary | ICD-10-CM | POA: Insufficient documentation

## 2020-03-28 DIAGNOSIS — J45909 Unspecified asthma, uncomplicated: Secondary | ICD-10-CM | POA: Insufficient documentation

## 2020-03-28 DIAGNOSIS — R0602 Shortness of breath: Secondary | ICD-10-CM | POA: Insufficient documentation

## 2020-03-28 DIAGNOSIS — Z20822 Contact with and (suspected) exposure to covid-19: Secondary | ICD-10-CM

## 2020-03-28 DIAGNOSIS — M791 Myalgia, unspecified site: Secondary | ICD-10-CM | POA: Insufficient documentation

## 2020-03-28 DIAGNOSIS — B349 Viral infection, unspecified: Secondary | ICD-10-CM

## 2020-03-28 LAB — POC SARS CORONAVIRUS 2 AG -  ED: SARS Coronavirus 2 Ag: NEGATIVE

## 2020-03-28 MED ORDER — ACETAMINOPHEN 500 MG PO TABS: 1000.0000 mg | ORAL_TABLET | Freq: Once | ORAL | Status: DC

## 2020-03-28 MED ORDER — KETOROLAC TROMETHAMINE 15 MG/ML IJ SOLN
15.0000 mg | Freq: Once | INTRAMUSCULAR | Status: AC
  Administered 2020-03-28: 15 mg via INTRAMUSCULAR
  Filled 2020-03-28: qty 1

## 2020-03-28 NOTE — ED Notes (Signed)
Pt stopped staff with complaints about having trouble breathing, staff checked her vitals, O2 sat 100%, triage RN notified

## 2020-03-28 NOTE — Discharge Instructions (Addendum)
You likely have a viral illness.  This should be treated symptomatically. Use Tylenol or ibuprofen as needed for fevers or body aches. Continue to use your inhaler as needed for shortness of breath, chest tightness, wheezing. Make sure you stay well-hydrated with water. Wash your hands frequently to prevent spread of infection. Follow-up with your primary care doctor in 1 week if your symptoms are not improving. Return to the emergency room if you develop chest pain, difficulty breathing, or any new or worsening symptoms.   Make sure you quarantine until your COVID PCR test results.

## 2020-03-28 NOTE — ED Provider Notes (Signed)
[facility] EMERGENCY DEPARTMENT Provider Note   CSN: [id number] Arrival date & time: 03/28/20  1435     History Chief Complaint  Patient presents with  . Generalized Body Aches  . Shortness of Breath    Brenda Gilmore is a 25 y.o. female presenting for evaluation of generalized body aches, cough, fever, shortness of breath.  Patient states her symptoms began last night.  She reports fever, generalized body aches, cough, and intermittent shortness of breath.  She was very short of breath upon arrival to the ER, however this resolved when she used her albuterol inhaler.  She has been taking Tylenol and ibuprofen for symptoms.  She reports a COVID exposure at work.  She is fully vaccinated against COVID.  She denies headache, chest pain, current shortness of breath, nausea, vomiting abdominal pain.  HPI     Past Medical History:  Diagnosis Date  . Asthma     There are no problems to display for this patient.   No past surgical history on file.   OB History   No obstetric history on file.     Family History  Problem Relation Age of Onset  . Healthy Mother   . Healthy Father     Social History   Tobacco Use  . Smoking status: Never Smoker  . Smokeless tobacco: Never Used  Vaping Use  . Vaping Use: Never used  Substance Use Topics  . Alcohol use: Yes  . Drug use: No    Home Medications Prior to Admission medications   Medication Sig Start Date End Date Taking? Authorizing Provider  albuterol (PROAIR HFA) 108 (90 Base) MCG/ACT inhaler Inhale 2 puffs into the lungs every 4 (four) hours as needed for wheezing or shortness of breath.  11/17/14   [provider]  ALBUTEROL SULFATE PO Inhale into the lungs.    [provider]  cyclobenzaprine (FLEXERIL) 10 MG tablet Take 1 tablet (10 mg total) by mouth 2 (two) times daily as needed for muscle spasms. 07/19/19   Lamptey, Britta Mccreedy, MD  ibuprofen (ADVIL) 600 MG tablet Take 1 tablet (600  mg total) by mouth every 6 (six) hours as needed. 07/19/19   Merrilee Jansky, MD  Adalimumab 40 MG/0.4ML PNKT Inject into the skin. 12/28/18 07/19/19  [provider]  fluticasone (FLONASE) 50 MCG/ACT nasal spray 1 spray by Each Nare route daily.  07/19/19  [provider]  montelukast (SINGULAIR) 10 MG tablet Take 10 mg by mouth daily as needed (for allergies).  11/17/14 07/19/19  [provider]    Allergies    Patient has no known allergies.  Review of Systems   Review of Systems  Constitutional: Positive for fever.  Respiratory: Positive for cough and shortness of breath (not currently).   Musculoskeletal: Positive for myalgias.    Physical Exam Updated Vital Signs BP 125/71 (BP Location: Right Arm)   Pulse 93   Temp 100 F (37.8 C) (Oral)   Resp 20   SpO2 98%   Physical Exam Vitals and nursing note reviewed.  Constitutional:      General: She is not in acute distress.    Appearance: She is well-developed and well-nourished.     Comments: Resting comfortably in the chair in no acute distress  HENT:     Head: Normocephalic and atraumatic.  Eyes:     Extraocular Movements: EOM normal.     Conjunctiva/sclera: Conjunctivae normal.     Pupils: Pupils are equal, round, and  reactive to light.  Cardiovascular:     Rate and Rhythm: Normal rate and regular rhythm.     Pulses: Normal pulses and intact distal pulses.  Pulmonary:     Effort: Pulmonary effort is normal. No respiratory distress.     Breath sounds: Normal breath sounds. No wheezing.     Comments: Speaking in full sentences.  Clear lung sounds in all fields.  No wheezing. spo2 stable on RA Abdominal:     General: There is no distension.     Palpations: Abdomen is soft. There is no mass.     Tenderness: There is no abdominal tenderness. There is no guarding or rebound.  Musculoskeletal:        General: Normal range of motion.     Cervical back: Normal range of motion and neck supple.  Skin:     General: Skin is warm and dry.     Capillary Refill: Capillary refill takes less than 2 seconds.  Neurological:     Mental Status: She is alert and oriented to person, place, and time.  Psychiatric:        Mood and Affect: Mood and affect normal.     ED Results / Procedures / Treatments   Labs (all labs ordered are listed, but only abnormal results are displayed) Labs Reviewed  POC SARS CORONAVIRUS 2 AG -  ED    EKG None  Radiology DG Chest Portable 1 View  Result Date: 03/28/2020 CLINICAL DATA:  Shortness of breath EXAM: PORTABLE CHEST 1 VIEW COMPARISON:  None. FINDINGS: The heart size and mediastinal contours are within normal limits. Both lungs are clear. The visualized skeletal structures are unremarkable. IMPRESSION: No acute cardiopulmonary process. Electronically Signed   By: Maudry Mayhew MD   On: 03/28/2020 15:22    Procedures Procedures (including critical care time)  Medications Ordered in ED Medications  ketorolac (TORADOL) 15 MG/ML injection 15 mg (has no administration in time range)    ED Course  I have reviewed the triage vital signs and the nursing notes.  Pertinent labs & imaging results that were available during my care of the patient were reviewed by me and considered in my medical decision making (see chart for details).    MDM Rules/Calculators/A&P                          Patient presenting with 1 day h/o URI symptoms.  Physical exam reassuring, patient is afebrile and appears nontoxic.  Pulmonary exam reassuring.  Doubt pneumonia, strep, other bacterial infection, or peritonsillar abscess. cxr obtained from triage reviewed interpreted by me, no pneumonia pneumothorax or effusion.  Rapid COVID test was negative, however as this is an antigen test, will recommend PCR.  Patient states she took 1 outpatient and is pending results, will not repeat.  Discussed quarantine instructions until COVID test results.  Discussed symptomatic treatment at home.   Patient to follow-up with primary care as needed.  At this time, patient appears safe for discharge.  Return precautions given.  Patient states she understands and agrees to plan.  Brenda Gilmore was evaluated in Emergency Department on 03/28/2020 for the symptoms described in the history of present illness. She was evaluated in the context of the global COVID-19 pandemic, which necessitated consideration that the patient might be at risk for infection with the SARS-CoV-2 virus that causes COVID-19. Institutional protocols and algorithms that pertain to the evaluation of patients at risk for COVID-19 are in a  state of rapid change based on information released by regulatory bodies including the CDC and federal and state organizations. These policies and algorithms were followed during the patient's care in the ED.  Final Clinical Impression(s) / ED Diagnoses Final diagnoses:  Close exposure to COVID-19 virus  Viral illness    Rx / DC Orders ED Discharge Orders    None       Alveria Apley, PA-C 03/28/20 2035    Benjiman Core, MD 03/28/20 [phone]

## 2020-03-28 NOTE — ED Notes (Signed)
Frank(Uncle) called/would like a call back from patient.

## 2020-03-28 NOTE — ED Triage Notes (Signed)
Pt reports generalized body aches, fever, and shob x 2 days. Exposed to covid at work.

## 2020-03-28 NOTE — ED Notes (Signed)
Pt taking her own ibuprofen for fever

## 2020-07-03 MED ORDER — DIAZEPAM 5 MG TABLET
ORAL_TABLET | ORAL | 0 refills | 0.00000 days | Status: CP
Start: 2020-07-03 — End: ?

## 2020-07-04 ENCOUNTER — Ambulatory Visit: Admit: 2020-07-04 | Discharge: 2020-07-05 | Payer: PRIVATE HEALTH INSURANCE

## 2020-07-04 DIAGNOSIS — D492 Neoplasm of unspecified behavior of bone, soft tissue, and skin: Principal | ICD-10-CM

## 2020-11-26 ENCOUNTER — Ambulatory Visit
Admit: 2020-11-26 | Discharge: 2020-11-26 | Disposition: A | Discharge status: Home / Self Care | Payer: PRIVATE HEALTH INSURANCE | Attending: Emergency Medicine

## 2020-11-26 ENCOUNTER — Emergency Department
Admit: 2020-11-26 | Discharge: 2020-11-26 | Disposition: A | Discharge status: Home / Self Care | Payer: PRIVATE HEALTH INSURANCE | Attending: Emergency Medicine

## 2020-11-26 DIAGNOSIS — M79601 Pain in right arm: Principal | ICD-10-CM

## 2020-11-26 MED ORDER — KETOROLAC 10 MG TABLET
ORAL_TABLET | Freq: Four times a day (QID) | ORAL | 0 refills | 5 days | Status: CP | PRN
Start: 2020-11-26 — End: 2020-12-01

## 2020-11-27 ENCOUNTER — Ambulatory Visit
Admit: 2020-11-27 | Discharge: 2020-11-27 | Disposition: A | Discharge status: Home / Self Care | Payer: PRIVATE HEALTH INSURANCE

## 2020-11-27 ENCOUNTER — Emergency Department
Admit: 2020-11-27 | Discharge: 2020-11-27 | Disposition: A | Discharge status: Home / Self Care | Payer: PRIVATE HEALTH INSURANCE

## 2020-11-27 DIAGNOSIS — M79601 Pain in right arm: Principal | ICD-10-CM

## 2020-11-27 DIAGNOSIS — T148XXA Other injury of unspecified body region, initial encounter: Principal | ICD-10-CM

## 2020-11-27 MED ORDER — CYCLOBENZAPRINE 10 MG TABLET
ORAL_TABLET | Freq: Three times a day (TID) | ORAL | 0 refills | 7 days | Status: CP | PRN
Start: 2020-11-27 — End: ?

## 2020-12-11 ENCOUNTER — Ambulatory Visit
Admit: 2020-12-11 | Discharge: 2020-12-12 | Payer: PRIVATE HEALTH INSURANCE | Attending: Advanced Practice Midwife | Primary: Advanced Practice Midwife

## 2020-12-11 DIAGNOSIS — Z0000 Encounter for general adult medical examination without abnormal findings: Principal | ICD-10-CM

## 2021-01-15 ENCOUNTER — Ambulatory Visit: Admit: 2021-01-15 | Discharge: 2021-01-16 | Payer: PRIVATE HEALTH INSURANCE

## 2021-01-15 DIAGNOSIS — L732 Hidradenitis suppurativa: Principal | ICD-10-CM

## 2021-01-15 DIAGNOSIS — L910 Hypertrophic scar: Principal | ICD-10-CM

## 2021-01-15 MED ORDER — DOXYCYCLINE HYCLATE 100 MG TABLET
ORAL_TABLET | Freq: Two times a day (BID) | ORAL | 2 refills | 30.00000 days | Status: CP
Start: 2021-01-15 — End: 2021-01-15

## 2021-03-16 ENCOUNTER — Ambulatory Visit: Admit: 2021-03-16 | Discharge: 2021-03-17

## 2021-04-06 ENCOUNTER — Ambulatory Visit: Admit: 2021-04-06 | Attending: Advanced Practice Midwife | Primary: Advanced Practice Midwife

## 2021-05-30 IMAGING — DX DG CHEST 1V PORT
1 series · 1 of 1 positions shown · non-contrast
Comparison: None.

CLINICAL DATA: Shortness of breath

EXAM:
PORTABLE CHEST 1 VIEW

[chest ap]
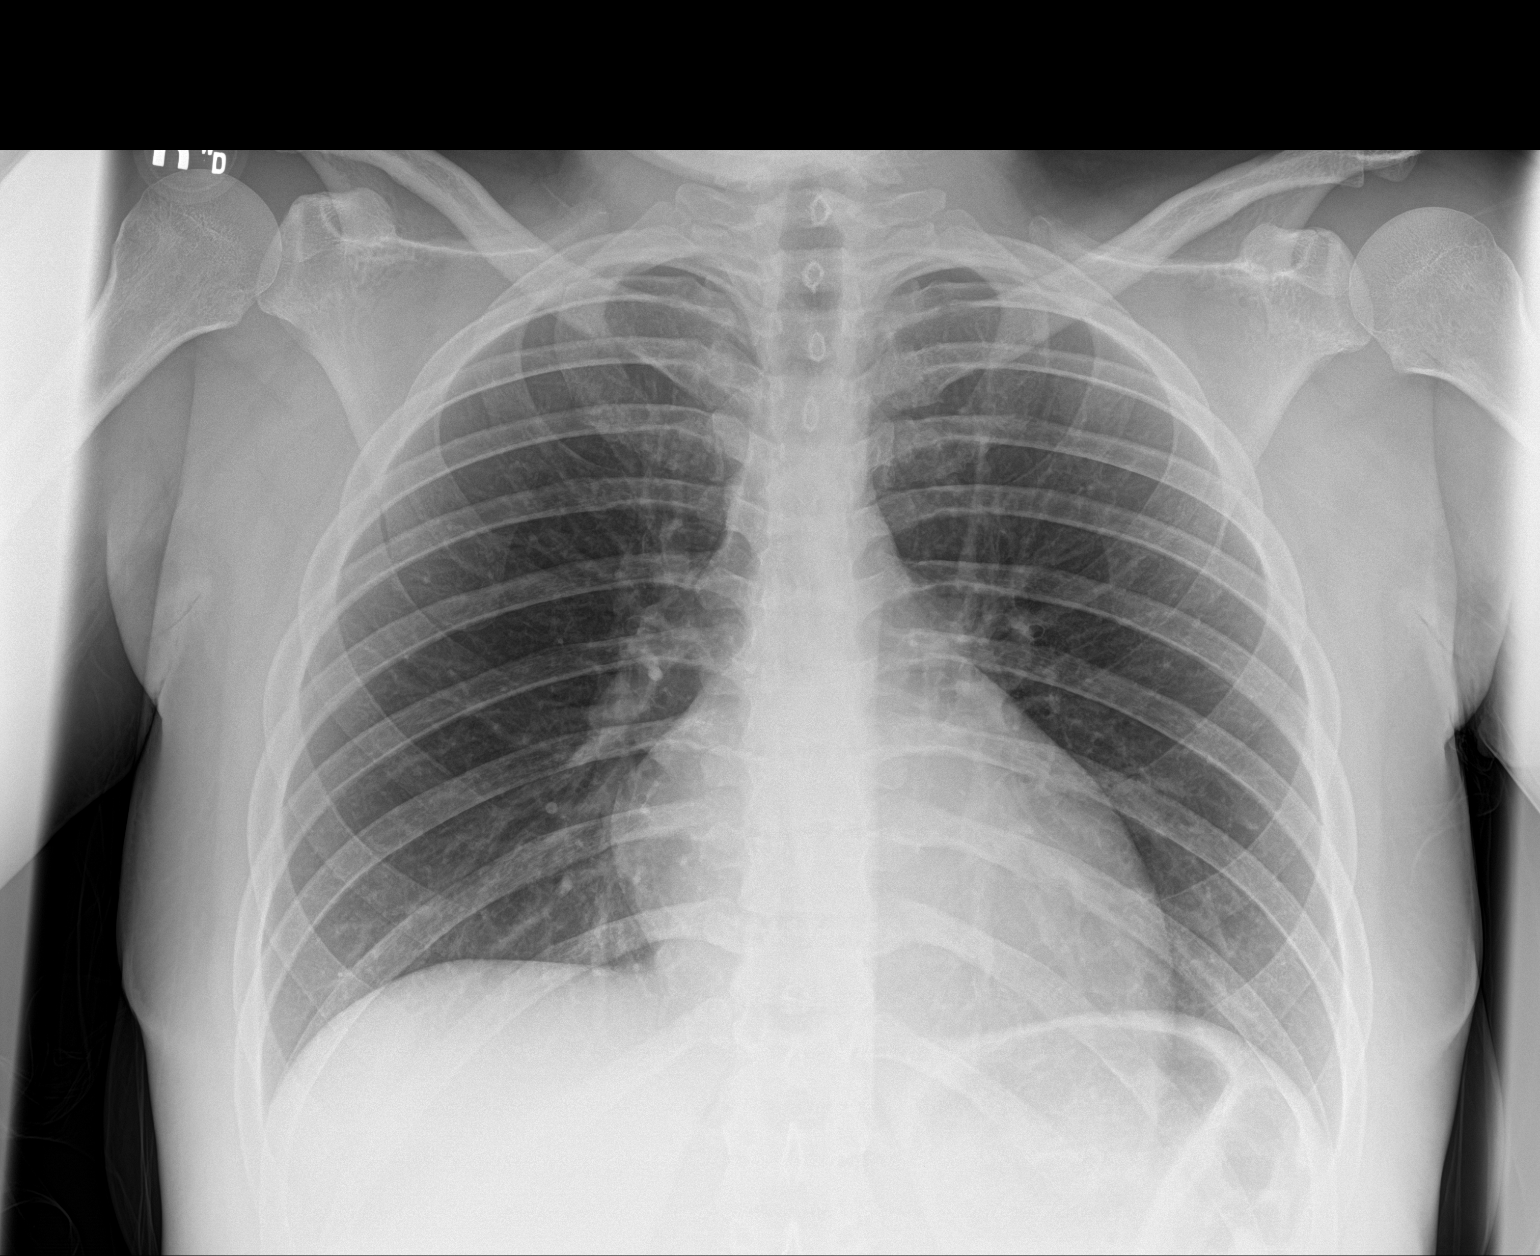

[1 of 1 positions shown; findings below may reference images not displayed]

FINDINGS: The heart size and mediastinal contours are within normal limits.
Both lungs are clear. The visualized skeletal structures are
unremarkable.
IMPRESSION: No acute cardiopulmonary process.

## 2021-06-18 ENCOUNTER — Ambulatory Visit: Admit: 2021-06-18 | Discharge: 2021-06-19

## 2021-06-18 DIAGNOSIS — F411 Generalized anxiety disorder: Principal | ICD-10-CM

## 2021-06-18 DIAGNOSIS — L732 Hidradenitis suppurativa: Principal | ICD-10-CM

## 2021-06-18 MED ORDER — DIAZEPAM 10 MG TABLET
ORAL_TABLET | 0 refills | 0 days | Status: CP
Start: 2021-06-18 — End: ?

## 2021-07-27 ENCOUNTER — Ambulatory Visit: Admit: 2021-07-27

## 2021-10-30 ENCOUNTER — Ambulatory Visit: Admit: 2021-10-30 | Discharge: 2021-10-31

## 2021-10-30 MED ORDER — HYDROCODONE 5 MG-ACETAMINOPHEN 325 MG TABLET
ORAL_TABLET | 0 refills | 0 days | Status: CP
Start: 2021-10-30 — End: ?

## 2022-12-08 ENCOUNTER — Ambulatory Visit
Admit: 2022-12-08 | Discharge: 2022-12-08 | Disposition: A | Discharge status: Home / Self Care | Payer: MEDICAID | Attending: Emergency Medicine

## 2022-12-08 DIAGNOSIS — J020 Streptococcal pharyngitis: Principal | ICD-10-CM

## 2022-12-08 MED ORDER — AMOXICILLIN 500 MG CAPSULE
ORAL_CAPSULE | Freq: Two times a day (BID) | ORAL | 0 refills | 10 days | Status: CP
Start: 2022-12-08 — End: 2022-12-08

## 2022-12-08 MED ORDER — LIDOCAINE HCL 2 % MUCOSAL SOLUTION
OROMUCOSAL | 0 refills | 3 days | Status: CP | PRN
Start: 2022-12-08 — End: 2022-12-13

## 2022-12-08 MED ORDER — AMOXICILLIN 875 MG-POTASSIUM CLAVULANATE 125 MG TABLET
ORAL_TABLET | Freq: Two times a day (BID) | ORAL | 0 refills | 7 days | Status: CP
Start: 2022-12-08 — End: 2022-12-15
